# Patient Record
Sex: Male | Born: 1975 | Race: White | Hispanic: No | Marital: Single | Smoking: Former smoker
Health system: Southern US, Community
[De-identification: ages and names within clinical notes are randomized; demographics above are authoritative.]

## PROBLEM LIST (undated history)

## (undated) DIAGNOSIS — M949 Disorder of cartilage, unspecified: Secondary | ICD-10-CM

## (undated) DIAGNOSIS — I341 Nonrheumatic mitral (valve) prolapse: Secondary | ICD-10-CM

## (undated) DIAGNOSIS — M1990 Unspecified osteoarthritis, unspecified site: Secondary | ICD-10-CM

## (undated) DIAGNOSIS — T7840XA Allergy, unspecified, initial encounter: Secondary | ICD-10-CM

## (undated) DIAGNOSIS — R011 Cardiac murmur, unspecified: Secondary | ICD-10-CM

## (undated) HISTORY — DX: Unspecified osteoarthritis, unspecified site: M19.90

## (undated) HISTORY — DX: Disorder of cartilage, unspecified: M94.9

## (undated) HISTORY — DX: Cardiac murmur, unspecified: R01.1

## (undated) HISTORY — DX: Nonrheumatic mitral (valve) prolapse: I34.1

## (undated) HISTORY — PX: EYE SURGERY: SHX253

## (undated) HISTORY — DX: Allergy, unspecified, initial encounter: T78.40XA

---
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
## 2000-01-30 HISTORY — PX: HERNIA REPAIR: SHX51

## 2001-03-03 ENCOUNTER — Ambulatory Visit (HOSPITAL_COMMUNITY): Admission: RE | Admit: 2001-03-03 | Discharge: 2001-03-03 | Payer: Self-pay | Admitting: General Surgery

## 2001-06-09 ENCOUNTER — Emergency Department (HOSPITAL_COMMUNITY): Admission: EM | Admit: 2001-06-09 | Discharge: 2001-06-09 | Payer: Self-pay | Admitting: *Deleted

## 2008-05-24 ENCOUNTER — Emergency Department (HOSPITAL_COMMUNITY): Admission: EM | Admit: 2008-05-24 | Discharge: 2008-05-24 | Payer: Self-pay | Admitting: Emergency Medicine

## 2010-06-16 NOTE — Op Note (Signed)
[facility]  Patient:    Kyle Casey, Kyle Casey Visit Number: [id number] MRN: [id number]          Service Type: DSU Location: DAY Attending Physician:  Dalia Heading Dictated by:   Franky Macho, M.D. Proc. Date: 03/03/01 Admit Date:  03/03/2001   CC:         Karleen Hampshire, M.D.   Operative Report  PREOPERATIVE DIAGNOSIS:  Left inguinal hernia.  POSTOPERATIVE DIAGNOSIS:  Left inguinal hernia.  PROCEDURE:  Left inguinal herniorrhaphy.  SURGEON:  Franky Macho, M.D.  ANESTHESIA:  General.  INDICATIONS:  The patient is a 35 year old white male who presents with a left inguinal hernia.  The risks and benefits of the procedure including bleeding, infection, and recurrence of the hernia were fully explained to the patient, who gave informed consent.  DESCRIPTION OF PROCEDURE: The patient was placed in the supine position. After general anesthesia was administered, the left groin region was prepped and draped using the usual sterile technique with Betadine.  An oblique incision was made in the left groin region down to the external oblique aponeurosis.  The aponeurosis was incised to the external ring. Ilioinguinal nerve was identified and retracted inferiorly from the operative field.  A Penrose drain was placed around the spermatic cord.  The patient was noted to have a large indirect hernia sac with omentum present.  The omentum was reduced into the abdominal cavity and a high ligation of the hernia sac was performed at the perineal level using 2-0 Novofil pursestring suture.  The excess hernia sac down to the testicle was then excised without difficulty.  A medium size Marlex mesh plug was placed along the anterior and medial aspect of the internal ring and secured to the transversalis fascia using a 2-0 Novofil interrupted suture.  Then onlay Marlex mesh patch was then placed along the floor of the inguinal canal and secured superiorly to the  conjoined tendon and inferiorly to the shelving edge of Pouparts ligament using a 2-0 Novofil interrupted suture.  The internal ring was recreated using a 2-0 Novofil interrupted suture.  The external oblique aponeurosis was reapproximated using a 2-0 Vicryl running suture.  The subcutaneous layer was reapproximated using a 3-0 Vicryl interrupted suture.  The skin was closed using a 4-0 Vicryl subcuticular suture.  Sensorcaine 0.5% was instilled in to the surrounding wound and the wound was covered with collodion.  All tape and needle counts correct at the end of the procedure.  The patient was awakened and transferred to PACU in stable condition.  Complications none. Specimen none.  Blood loss minimal. Dictated by:   Franky Macho, M.D. Attending Physician:  Dalia Heading DD:  03/03/01 TD:  03/03/01 Job: [id number]

## 2012-01-30 DIAGNOSIS — M949 Disorder of cartilage, unspecified: Secondary | ICD-10-CM

## 2012-01-30 HISTORY — DX: Disorder of cartilage, unspecified: M94.9

## 2012-07-24 ENCOUNTER — Other Ambulatory Visit (HOSPITAL_COMMUNITY): Payer: Self-pay

## 2012-07-24 ENCOUNTER — Ambulatory Visit (HOSPITAL_COMMUNITY)
Admission: RE | Admit: 2012-07-24 | Discharge: 2012-07-24 | Disposition: A | Discharge status: Home / Self Care | Payer: Self-pay | Source: Ambulatory Visit | Attending: Internal Medicine | Admitting: Internal Medicine

## 2012-07-24 DIAGNOSIS — I059 Rheumatic mitral valve disease, unspecified: Secondary | ICD-10-CM | POA: Insufficient documentation

## 2012-07-24 DIAGNOSIS — I341 Nonrheumatic mitral (valve) prolapse: Secondary | ICD-10-CM

## 2012-07-24 NOTE — Progress Notes (Addendum)
[facility]   2D echo with saline contrast completed 07/24/2012.   Veda Canning, RDCS

## 2013-06-05 ENCOUNTER — Other Ambulatory Visit (HOSPITAL_COMMUNITY): Payer: Self-pay | Admitting: Preventative Medicine

## 2013-06-05 DIAGNOSIS — I499 Cardiac arrhythmia, unspecified: Secondary | ICD-10-CM

## 2013-06-10 ENCOUNTER — Ambulatory Visit (HOSPITAL_COMMUNITY)
Admission: RE | Admit: 2013-06-10 | Discharge: 2013-06-10 | Disposition: A | Discharge status: Home / Self Care | Payer: Self-pay | Source: Ambulatory Visit | Attending: Cardiovascular Disease | Admitting: Cardiovascular Disease

## 2013-06-10 DIAGNOSIS — R9431 Abnormal electrocardiogram [ECG] [EKG]: Secondary | ICD-10-CM | POA: Insufficient documentation

## 2013-06-10 DIAGNOSIS — I059 Rheumatic mitral valve disease, unspecified: Secondary | ICD-10-CM

## 2013-06-10 DIAGNOSIS — I499 Cardiac arrhythmia, unspecified: Secondary | ICD-10-CM

## 2013-06-10 NOTE — Progress Notes (Signed)
2D Echocardiogram Complete.  06/10/2013   Gershon Shorten, RDCS  

## 2013-07-27 ENCOUNTER — Ambulatory Visit (HOSPITAL_COMMUNITY): Payer: Self-pay

## 2014-05-11 ENCOUNTER — Encounter: Payer: Self-pay | Admitting: *Deleted

## 2014-05-11 ENCOUNTER — Encounter: Payer: Self-pay | Admitting: Family Medicine

## 2014-05-11 DIAGNOSIS — T7840XA Allergy, unspecified, initial encounter: Secondary | ICD-10-CM | POA: Insufficient documentation

## 2014-05-11 DIAGNOSIS — M1990 Unspecified osteoarthritis, unspecified site: Secondary | ICD-10-CM | POA: Insufficient documentation

## 2014-05-13 ENCOUNTER — Ambulatory Visit (INDEPENDENT_AMBULATORY_CARE_PROVIDER_SITE_OTHER): Payer: BLUE CROSS/BLUE SHIELD | Admitting: Cardiology

## 2014-05-13 ENCOUNTER — Encounter: Payer: Self-pay | Admitting: Cardiology

## 2014-05-13 VITALS — BP 138/82 | HR 63 | Ht 67.0 in | Wt 138.0 lb

## 2014-05-13 DIAGNOSIS — I341 Nonrheumatic mitral (valve) prolapse: Secondary | ICD-10-CM

## 2014-05-13 DIAGNOSIS — R011 Cardiac murmur, unspecified: Secondary | ICD-10-CM | POA: Diagnosis not present

## 2014-05-13 NOTE — Patient Instructions (Signed)
Your physician wants you to follow-up in: 1 year with Dr. Lurena JoinerBranch You will receive a reminder letter in the mail two months in advance. If you don't receive a letter, please call our office to schedule the follow-up appointment.  Your physician recommends that you continue on your current medications as directed. Please refer to the Current Medication list given to you today.  WE WILL FAX DOT FORMS   Thank you for choosing [facility]!!

## 2014-05-13 NOTE — Progress Notes (Signed)
Clinical Summary Kyle Casey is a 39 y.o.male seen today as a new patient for irregular heart beat  1. Mitral valve prolapse - noted by echo 05/2013, reported mild MR. 2014 echo reported moderate MR - denies any SOB or DOE. Exercises regularly without troubles. No LE edema.  - denies any palpitations    Past Medical History  Diagnosis Date  . Allergy   . Heart murmur 12/08/1976  . 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 Arthritis      Allergies  Allergen Reactions  . Levaquin [Levofloxacin] Hives  . Penicillins Hives     No current outpatient prescriptions on file.   No current facility-administered medications for this visit.     Past Surgical History  Procedure Laterality Date  . Hernia repair  2002  . Eye surgery  age 52     lazy eye     Allergies  Allergen Reactions  . Levaquin [Levofloxacin] Hives  . Penicillins Hives      Family History  Problem Relation Age of Onset  . COPD Mother   . Hypertension Mother   . Arthritis Father   . Diabetes Father   . Stroke Father   . Hypertension Father   . Hyperlipidemia Father   . Asthma Brother   . Cancer Maternal Grandfather      Social History Kyle Casey reports that he quit smoking about 4 years ago. He has never used smokeless tobacco. Kyle Casey reports that he drinks about 3.6 oz of alcohol per week.   Review of Systems CONSTITUTIONAL: No weight loss, fever, chills, weakness or fatigue.  HEENT: Eyes: No visual loss, blurred vision, double vision or yellow sclerae.No hearing loss, sneezing, congestion, runny nose or sore throat.  SKIN: No rash or itching.  CARDIOVASCULAR: per HPI RESPIRATORY: No shortness of breath, cough or sputum.  GASTROINTESTINAL: No anorexia, nausea, vomiting or diarrhea. No abdominal pain or blood.  GENITOURINARY: No burning on urination, no polyuria NEUROLOGICAL: No headache, dizziness, syncope, paralysis, ataxia, numbness or tingling in the  extremities. No change in bowel or bladder control.  MUSCULOSKELETAL: No muscle, back pain, joint pain or stiffness.  LYMPHATICS: No enlarged nodes. No history of splenectomy.  PSYCHIATRIC: No history of depression or anxiety.  ENDOCRINOLOGIC: No reports of sweating, cold or heat intolerance. No polyuria or polydipsia.  Marland Kitchen.   Physical Examination p 63 bp 138/82 Wt 138 lbs BMI 22 Gen: resting comfortably, no acute distress HEENT: no scleral icterus, pupils equal round and reactive, no palptable cervical adenopathy,  CV: RRR, 2/6 systolic murmur at apex, no JVD, no carotid bruits Resp: Clear to auscultation bilaterally GI: abdomen is soft, non-tender, non-distended, normal bowel sounds, no hepatosplenomegaly MSK: extremities are warm, no edema.  Skin: warm, no rash Neuro:  no focal deficits Psych: appropriate affect   Diagnostic Studies 05/2013 echo Study Conclusions  - Left ventricle: The cavity size was mildly dilated. Wall thickness was normal. Systolic function was normal. The estimated ejection fraction was in the range of 50% to 55%. - Aortic valve: Valve area: 2.49cm^2(VTI). Valve area: 2.16cm^2 (Vmax). 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 MR not well characterized but appears mild Prolapse. Mild regurgitation. - Atrial septum: No defect or patent foramen ovale was identified.    Assessment and Plan   1. Mitral valve prolapse - no significant symptoms, echo with only mild MR. 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    F/u 1 year  Antoine Poche, M.D.

## 2014-05-26 ENCOUNTER — Ambulatory Visit: Payer: BLUE CROSS/BLUE SHIELD | Admitting: Physician Assistant

## 2014-06-14 ENCOUNTER — Ambulatory Visit (INDEPENDENT_AMBULATORY_CARE_PROVIDER_SITE_OTHER): Payer: BLUE CROSS/BLUE SHIELD | Admitting: Physician Assistant

## 2014-06-14 ENCOUNTER — Encounter: Payer: Self-pay | Admitting: Physician Assistant

## 2014-06-14 VITALS — BP 120/80 | HR 60 | Temp 98.2°F | Resp 19 | Wt 138.0 lb

## 2014-06-14 DIAGNOSIS — R011 Cardiac murmur, unspecified: Secondary | ICD-10-CM

## 2014-06-14 DIAGNOSIS — Z23 Encounter for immunization: Secondary | ICD-10-CM

## 2014-06-14 DIAGNOSIS — I341 Nonrheumatic mitral (valve) prolapse: Secondary | ICD-10-CM

## 2014-06-14 DIAGNOSIS — F649 Gender identity disorder, unspecified: Secondary | ICD-10-CM

## 2014-06-14 NOTE — Progress Notes (Signed)
Patient ID: Synetta ShadowJames E General MRN: [id number], DOB: 07/31/1975, 39 y.o. Date of Encounter: @DATE @  Chief Complaint:  Chief Complaint  Patient presents with  . Establish care.    HPI: 39 y.o. year old white male  presents as a new patient to establish care.  He states that he was without insurance until he just recently got insurance in January. Also says that he is now doing better financially. 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 Says that he is originally from this area and has always lived around [place] except for when he was in the [employer]. Went into [employer] in 1998. Has even been off to war at times. Has been in the [employer] after that. [place] [facility] [place] [employer] [facility] [place].  Says that he really needed to have a primary care provider established. Says that even when he had his DOT physical he had a really difficult time getting an echo ordered as he was supposed to have had a PCP.  He has mitral valve prolapse and did have echocardiogram with Dr. Wyline MoodBranch 06/10/13.  He says that he was raised in the [place] area and was raised [place] farm.  However, says that he has "always been different ". Says that he is interested in gender change. Is requesting a referral to gender therapist. Shows me a bottle which is called a Animatorstroven and says that [facility] and is taking this. Says that he has an appointment in November to have "his Adams apple shaved. " Says that he has been looking into options and he wants to have [place]. Says the doctor there that does this surgery went from male to male himself/herself. Patient also plans to proceed with laser hair removal as well as tattoo removal. Asking if he would have to have any referrals for these and we discussed that he can schedule this himself and would have to pay for this the insurance usually does not cover this.  No other concerns  today.   Past Medical History  Diagnosis Date  . Allergy   . Heart murmur 10/20/1976  . 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 Arthritis   . Mitral valve prolapse      Home Meds: Outpatient Prescriptions Prior to Visit  Medication Sig Dispense Refill  . ibuprofen (ADVIL,MOTRIN) 200 MG tablet Take 200 mg by mouth every 6 (six) hours as needed.    . Misc Natural Products (ESTROVEN + ENERGY MAX STRENGTH) TABS Take 2 tablets by mouth daily.     No facility-administered medications prior to visit.    Allergies:  Allergies  Allergen Reactions  . Levaquin [Levofloxacin] Hives  . Penicillins Hives    History   Social History  . Marital Status: Single    Spouse Name: N/A  . Number of Children: N/A  . Years of Education: N/A   Occupational History  . Not on file.   Social History Main Topics  . Smoking status: Former Smoker -- 2.00 packs/day for 20 years    Types: Cigarettes    Start date: 02/04/1990    Quit date: 05/11/2010  . Smokeless tobacco: Never Used  . Alcohol Use: 3.6 oz/week    6 Cans of beer per week  . Drug Use: No  . Sexual Activity: Not Currently   Other Topics Concern  . Not on  file   Social History Narrative    Family History  Problem Relation Age of Onset  . COPD Mother   . Hypertension Mother   . Arthritis Father   . Diabetes Father   . Stroke Father   . Hypertension Father   . Hyperlipidemia Father   . Asthma Brother   . Cancer Maternal Grandfather   . Heart murmur Mother   . Heart murmur Sister      Review of Systems:  See HPI for pertinent ROS. All other ROS negative.    Physical Exam: Blood pressure 120/80, pulse 60, temperature 98.2 F (36.8 C), temperature source Oral, resp. rate 19, weight 138 lb (62.596 kg)., Body mass index is 21.61 kg/(m^2). General: WNWD WM. Appears in no acute distress. Head: Normocephalic, atraumatic, eyes without discharge, sclera non-icteric, nares are without  discharge. Bilateral auditory canals clear, TM's are without perforation, pearly grey and translucent with reflective cone of light bilaterally. Oral cavity moist, posterior pharynx without exudate, erythema, peritonsillar abscess, or post nasal drip.  Neck: Supple. No thyromegaly. No lymphadenopathy. Lungs: Clear bilaterally to auscultation without wheezes, rales, or rhonchi. Breathing is unlabored. Heart: Regular Rhythm. Murmur.  Abdomen: Soft, non-tender, non-distended with normoactive bowel sounds. No hepatomegaly. No rebound/guarding. No obvious abdominal masses. Musculoskeletal:  Strength and tone normal for age. Extremities/Skin: Warm and dry.  No edema. No rashes or suspicious lesions. Neuro: Alert and oriented X 3. Moves all extremities spontaneously. Gait is normal. CNII-XII grossly in tact. Psych:  Responds to questions appropriately with a normal affect.     ASSESSMENT AND PLAN:  39 y.o. year old male with  1. Heart murmur  2. 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 Rather than putting in an incorrect order, I have spoken to [place] referral nurse and have let her know the situation and white type of referral we need. She has been given a list of providers that do this work and she is to place the order to correspond accordingly. We'll refer him to a reproductive endocrinologist to specializes in mass and also refer to a gender therapist.  4. Preventive Care: He is not fasting today. Says that he leaves tomorrow will be gone for 2-3 weeks on his truck. Try to return fasting for lab work in the future. Immunizations: Only immunization indicated is a tetanus vaccine. He reports that it has been > 10 years since he had tetanus vaccine and is agreeable to update this today.   Need for Tdap vaccination - Tdap vaccine greater than or equal to 7yo  IM      Signed, [address], [place], [facility] 06/14/2014 11:09 AM

## 2014-06-17 ENCOUNTER — Other Ambulatory Visit: Payer: Self-pay | Admitting: Physician Assistant

## 2014-06-17 DIAGNOSIS — F649 Gender identity disorder, unspecified: Secondary | ICD-10-CM

## 2014-06-23 ENCOUNTER — Telehealth: Payer: Self-pay | Admitting: Physician Assistant

## 2014-06-23 NOTE — Telephone Encounter (Signed)
Patient returning call [phone]

## 2014-09-06 ENCOUNTER — Ambulatory Visit: Payer: BLUE CROSS/BLUE SHIELD | Admitting: Physician Assistant

## 2014-09-07 ENCOUNTER — Ambulatory Visit (INDEPENDENT_AMBULATORY_CARE_PROVIDER_SITE_OTHER): Payer: BLUE CROSS/BLUE SHIELD | Admitting: Family Medicine

## 2014-09-07 ENCOUNTER — Encounter: Payer: Self-pay | Admitting: Family Medicine

## 2014-09-07 VITALS — BP 122/64 | HR 76 | Temp 98.4°F | Resp 12 | Ht 67.0 in | Wt 123.0 lb

## 2014-09-07 DIAGNOSIS — R634 Abnormal weight loss: Secondary | ICD-10-CM

## 2014-09-07 DIAGNOSIS — K4090 Unilateral inguinal hernia, without obstruction or gangrene, not specified as recurrent: Secondary | ICD-10-CM

## 2014-09-07 NOTE — Patient Instructions (Signed)
Ultrasound  To be done  F/U pending results

## 2014-09-07 NOTE — Progress Notes (Signed)
Patient ID: Kyle Casey, male   DOB: 1975-08-05, 39 y.o.   MRN: [id number]   Subjective:    Patient ID: Kyle Casey, male    DOB: 1975/12/19, 39 y.o.   MRN: [id number]  Patient presents for R Sided Knot   patient here with a knot in his right inguinal area. He does have history of inguinal hernia on the left side which he had repaired in the past. He was concerned it was due to his hormone therapy as he is undergoing gender transformation. He was using an over-the-counter Talmadge Chad from the [facility] for a couple months now he is being followed by an endocrinologist [place] Dr. Ruby Cola it has been on estradiol as well as spironolactone. He noticed a knot for the past couple months but noticed that it had been enlarging. He was sexually active with one partner. He denies any penile discharge denies any other rash denies any testicular pain. 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 Review Of Systems:  GEN- denies fatigue, fever, weight loss,weakness, recent illness HEENT- denies eye drainage, change in vision, nasal discharge, CVS- denies chest pain, palpitations RESP- denies SOB, cough, wheeze ABD- denies N/V, change in stools, abd pain GU- denies dysuria, hematuria, dribbling, incontinence MSK- denies joint pain, muscle aches, injury Neuro- denies headache, dizziness, syncope, seizure activity       Objective:    BP 122/64 mmHg  Pulse 76  Temp(Src) 98.4 F (36.9 C) (Oral)  Resp 12  Ht  (1.702 m)  Wt 123 lb (55.792 kg)  BMI 19.26 kg/m2 GEN- NAD, alert and oriented x3 HEENT- PERRL, EOMI, non injected sclera, pink conjunctiva, MMM, oropharynx clear CVS- RRR, 3/6 murmur- prolapse RESP-CTAB ABD-NABS,soft,NT,ND GU- testes desceneded bilat, NT, no penile lesions, swelling of right inguinal region, mild TTP, minimally reducible EXT- No edema Pulses- Radial 2+        Assessment & Plan:      Problem List Items Addressed  This Visit    None    Visit Diagnoses    Unilateral inguinal hernia without obstruction or gangrene, recurrence not specified    -  Primary    based on apperance this is a small inguinal hernia, other differentials lymph node, doubt related to hormone therapy, send for [place] to confirm    Relevant Orders    US Scrotum    Loss of weight        Subsequently he has lost some weight, he often will not eat, or only eat once, 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 Query if trying to lose weight to appear more feminine       Note: This dictation was prepared with Dragon dictation along with smaller phrase technology. Any transcriptional errors that result from this process are unintentional.

## 2014-09-08 ENCOUNTER — Other Ambulatory Visit: Payer: Self-pay | Admitting: Family Medicine

## 2014-09-08 DIAGNOSIS — K4090 Unilateral inguinal hernia, without obstruction or gangrene, not specified as recurrent: Secondary | ICD-10-CM

## 2014-09-10 ENCOUNTER — Telehealth: Payer: Self-pay | Admitting: Family Medicine

## 2014-09-10 ENCOUNTER — Ambulatory Visit (HOSPITAL_COMMUNITY)
Admission: RE | Admit: 2014-09-10 | Discharge: 2014-09-10 | Disposition: A | Discharge status: Home / Self Care | Payer: BLUE CROSS/BLUE SHIELD | Source: Ambulatory Visit | Attending: Family Medicine | Admitting: Family Medicine

## 2014-09-10 DIAGNOSIS — K4090 Unilateral inguinal hernia, without obstruction or gangrene, not specified as recurrent: Secondary | ICD-10-CM | POA: Insufficient documentation

## 2014-09-10 NOTE — Telephone Encounter (Addendum)
Radiology called changing request to Pelvic ultrasound limited to look for hernia.  Said testicular ultrasound would not do.  OK given for change.

## 2014-09-10 NOTE — Telephone Encounter (Signed)
noted 

## 2014-09-27 ENCOUNTER — Encounter: Payer: Self-pay | Admitting: Family Medicine

## 2015-06-15 ENCOUNTER — Ambulatory Visit: Payer: BLUE CROSS/BLUE SHIELD | Admitting: Physician Assistant

## 2016-01-31 ENCOUNTER — Telehealth: Payer: Self-pay | Admitting: Cardiology

## 2016-01-31 NOTE — Telephone Encounter (Signed)
Numerous attempts to contact patient with recall letters. Unable to reach by telephone. with no success.   [[id number]][[id number]] 05/13/2014 8:58 AM New [10]    [System] 01/30/2015 11:01 PM Notification Sent [20]   Megan SalonVicky T Slaughter [[id number]][[id number]] 05/25/2015 10:33 AM Notification Sent [20]   Donata DuffVicky T Slaughter [[id number]][[id number]] 09/14/2015 8:40 AM Notification Sent [20]   Geraldine ContrasStephanie R Smith [[id number]][[id number]] 10/12/2015 11:13 AM Notification Sent [20]   Geraldine ContrasStephanie R Smith [[id number]][[id number]] 01/17/2016 2:13 PM Notification Sent [20]

## 2016-02-09 IMAGING — US US PELVIS LIMITED
1 series · 3 of 3 positions shown · non-contrast
Comparison: None

CLINICAL DATA: RIGHT inguinal hernia

EXAM:
LIMITED ULTRASOUND OF PELVIS
TECHNIQUE: Limited transabdominal ultrasound examination of the pelvis was
performed.

[Series 1: us pelvis limited · 0.04mm/px · 3 acquisitions, 3 frames shown]
[im 1/3]
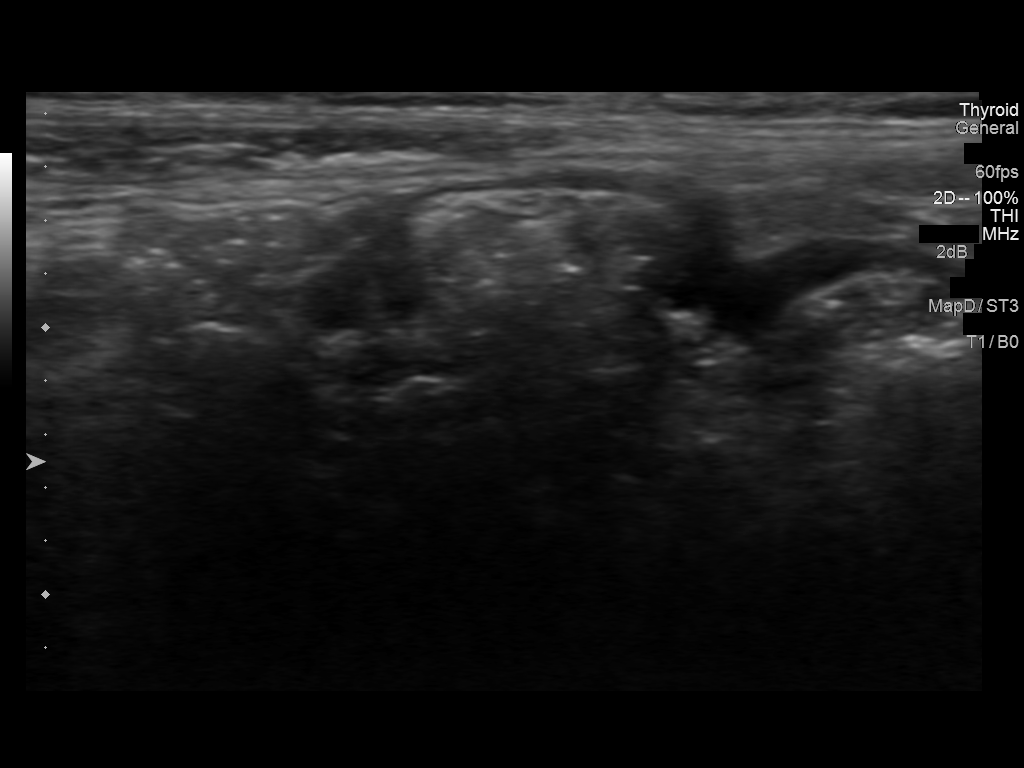
[im 2/3]
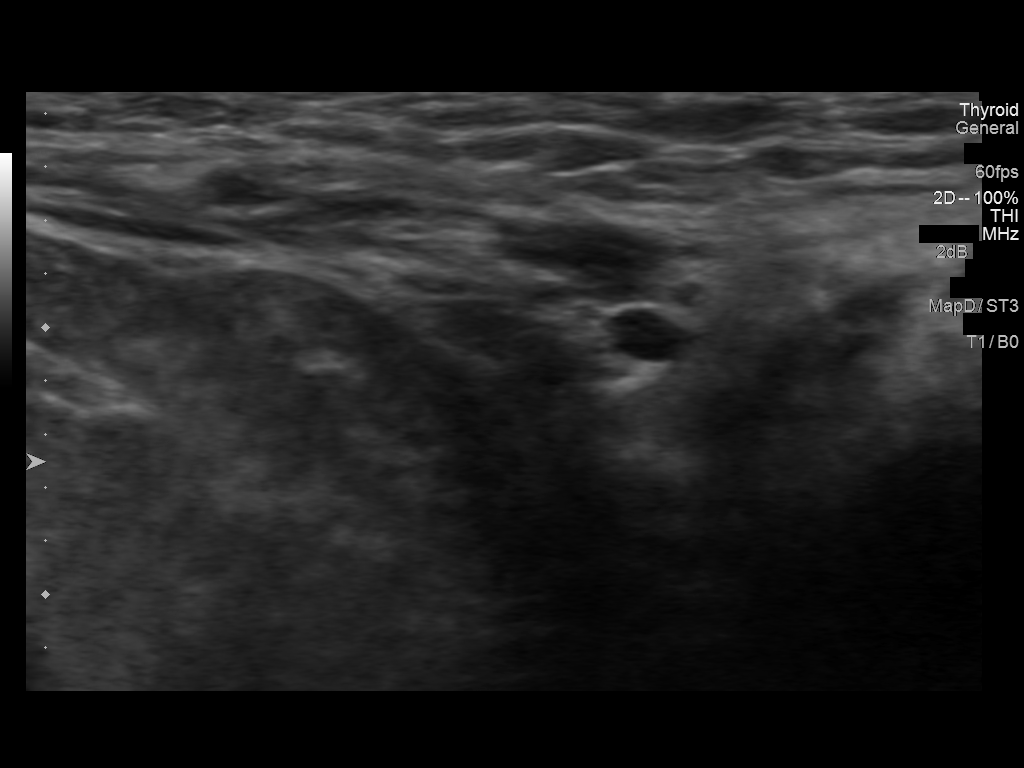
[im 3/3]
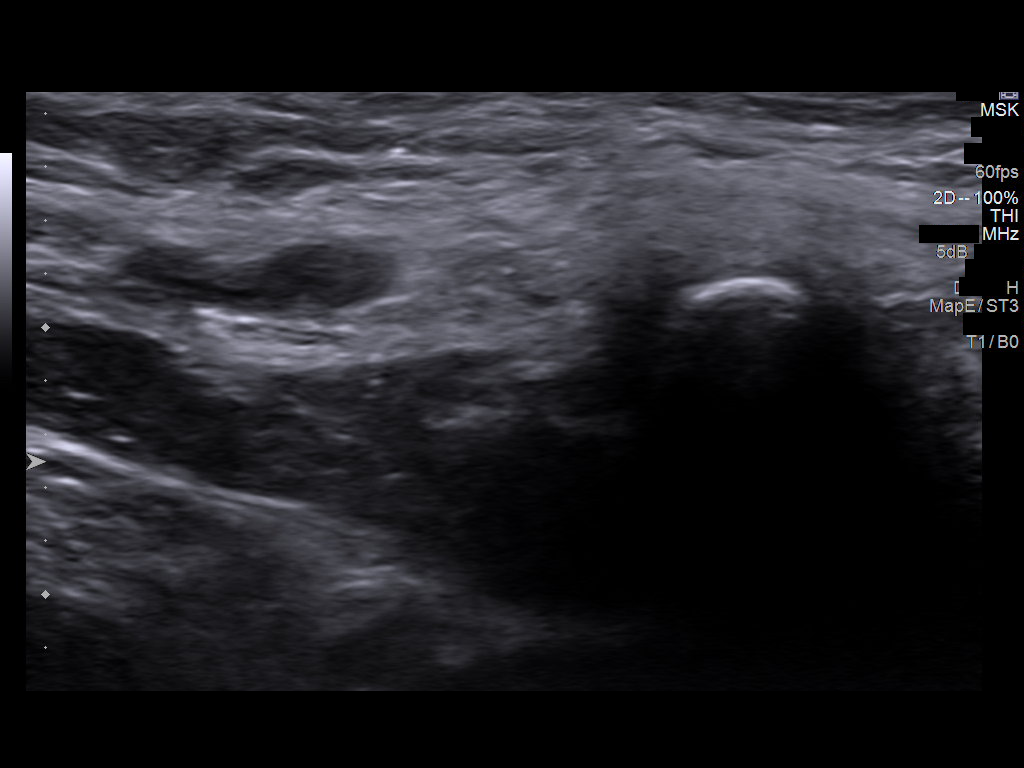

[3 of 3 positions shown; findings below may reference images not displayed]

FINDINGS: Sonography of the RIGHT inguinal region and RIGHT inguinal canal was
performed.

I performed additional imaging during Valsalva maneuver.

Small amount of fat identified surrounding the spermatic cord
vessels in the RIGHT inguinal canal.

With Valsalva maneuver, this material extends inferiorly within the
inguinal canal likely representing a RIGHT inguinal hernia.

This is palpable within the inguinal canal on physical exam.

No bowel herniation identified.
IMPRESSION: Probable RIGHT inguinal hernia, containing fat but without
identification of bowel.
